# Patient Record
Sex: Female | Born: 1995 | Race: White | Hispanic: No | Marital: Single | Smoking: Former smoker
Health system: Southern US, Community
[De-identification: ages and names within clinical notes are randomized; demographics above are authoritative.]

---
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
## 2016-07-21 ENCOUNTER — Emergency Department: Payer: Self-pay

## 2016-07-21 ENCOUNTER — Emergency Department
Admission: EM | Admit: 2016-07-21 | Discharge: 2016-07-21 | Disposition: A | Discharge status: Home / Self Care | Payer: Self-pay | Attending: Emergency Medicine | Admitting: Emergency Medicine

## 2016-07-21 DIAGNOSIS — Y999 Unspecified external cause status: Secondary | ICD-10-CM | POA: Insufficient documentation

## 2016-07-21 DIAGNOSIS — Y929 Unspecified place or not applicable: Secondary | ICD-10-CM | POA: Insufficient documentation

## 2016-07-21 DIAGNOSIS — S42401A Unspecified fracture of lower end of right humerus, initial encounter for closed fracture: Secondary | ICD-10-CM | POA: Insufficient documentation

## 2016-07-21 DIAGNOSIS — Y9351 Activity, roller skating (inline) and skateboarding: Secondary | ICD-10-CM | POA: Insufficient documentation

## 2016-07-21 MED ORDER — NAPROXEN 500 MG PO TBEC: 500.0000 mg | DELAYED_RELEASE_TABLET | Freq: Two times a day (BID) | ORAL | 0 refills | Status: DC

## 2016-07-21 MED ORDER — HYDROCODONE-ACETAMINOPHEN 5-325 MG PO TABS: 1.0000 | ORAL_TABLET | Freq: Three times a day (TID) | ORAL | 0 refills | Status: AC | PRN

## 2016-07-21 MED ORDER — HYDROCODONE-ACETAMINOPHEN 5-325 MG PO TABS
1.0000 | ORAL_TABLET | Freq: Once | ORAL | Status: AC
  Administered 2016-07-21: 1 via ORAL
  Filled 2016-07-21: qty 1

## 2016-07-21 NOTE — ED Triage Notes (Signed)
Pt states she fell off a skate board and injured her right elbow..Marland Kitchen

## 2016-07-21 NOTE — ED Provider Notes (Signed)
[facility] Emergency Department Provider Note ____________________________________________  Time seen: 1225  I have reviewed the triage vital signs and the nursing notes.  HISTORY  Chief Complaint  Elbow Pain  HPI Faustino CongressJennifer J Kops is a 20 y.o. female, left-hand dominant, reports to the ED for evaluation of right elbow pain. Patient describes falling while skateboarding about an hour prior to arrival. Since that time she's had increased pain and tightness to the right elbow. Patient also notes some tingling to the fingertips distally. She denies any other injury at this time.  History reviewed. No pertinent past medical history.  Patient Active Problem List   Diagnosis Date Noted  . URI 11/30/2007    History reviewed. No pertinent surgical history.  Prior to Admission medications   Medication Sig Start Date End Date Taking? Authorizing Provider  diphenhydrAMINE (SOMINEX) 25 MG tablet Take 25 mg by mouth at bedtime as needed. For allergy    Historical Provider, MD  HYDROcodone-acetaminophen (NORCO) 5-325 MG tablet Take 1 tablet by mouth every 8 (eight) hours as needed. 07/21/16   Destry Bezdek V Bacon Tyria Springer, PA-C  naproxen (EC NAPROSYN) 500 MG EC tablet Take 1 tablet (500 mg total) by mouth 2 (two) times daily with a meal. 07/21/16   Lazara Grieser V Bacon Nygeria Lager, PA-C    Allergies Review of patient's allergies indicates no known allergies.  No family history on file.  Social History Social History  Substance Use Topics  . Smoking status: Never Smoker  . Smokeless tobacco: Never Used  . Alcohol use Yes    Review of Systems  Constitutional: Negative for fever. Cardiovascular: Negative for chest pain. Respiratory: Negative for shortness of breath. Musculoskeletal: Negative for back pain. Right elbow pain as above. Skin: Negative for rash. Neurological: Negative for headaches, focal weakness or  numbness. ____________________________________________  PHYSICAL EXAM:  VITAL SIGNS: ED Triage Vitals  Enc Vitals Group     BP 07/21/16 1152 (!) 110/99     Pulse Rate 07/21/16 1152 (!) 142     Resp 07/21/16 1152 16     Temp 07/21/16 1152 98.7 F (37.1 C)     Temp Source 07/21/16 1152 Oral     SpO2 07/21/16 1152 94 %     Weight 07/21/16 1143 116 lb (52.6 kg)     Height 07/21/16 1143 5\' 5"  (1.651 m)     Head Circumference --      Peak Flow --      Pain Score 07/21/16 1143 10     Pain Loc --      Pain Edu? --      Excl. in GC? --    Constitutional: Alert and oriented. Well appearing and in no distress. Head: Normocephalic and atraumatic. Cardiovascular: Normal rate, regular rhythm. Normal distal pulses. Respiratory: Normal respiratory effort. No wheezes/rales/rhonchi. Gastrointestinal: Soft and nontender. No distention. Musculoskeletal:Right elbow without obvious deformity, dislocation, or effusion. Patient is tender to palpation over the medial and lateral epicondyles. She didn't demonstrate a normal composite fist distally. Decreased elbow range of motion secondary to pain. Nontender with normal range of motion in all extremities.  Neurologic:  Normal gait without ataxia. Normal speech and language. No gross focal neurologic deficits are appreciated. Skin:  Skin is warm, dry and intact. No rash noted. ____________________________________________   RADIOLOGY  Right Elbow  IMPRESSION: Positive for elbow joint effusion. In the setting of trauma this likely indicates an intra-articular fracture, statistically most likely the radial head. Consider radial head x-ray views, if it  would affect clinical management.  I, Mahalie Kanner, Charlesetta IvoryJenise V Bacon, personally viewed and evaluated these images (plain radiographs) as part of my medical decision making, as well as reviewing the written report by the radiologist. ____________________________________________  PROCEDURES  Elbow OCL  Splint Arm sling ____________________________________________  INITIAL IMPRESSION / ASSESSMENT AND PLAN / ED COURSE  Patient with an elbow joint effusion confirmed on x-ray. Questionable intra-articular radial head fracture seems probable. She is fitted with an appropriate OCL splint and arm sling. She will follow-up with orthopedics for further fracture care.  Clinical Course   ____________________________________________  FINAL CLINICAL IMPRESSION(S) / ED DIAGNOSES  Final diagnoses:  Elbow fracture, right, closed, initial encounter     Lissa HoardJenise V Bacon Isiaha Greenup, PA-C 07/21/16 1934    Governor Rooksebecca Lord, MD 07/23/16 1441

## 2016-07-21 NOTE — Discharge Instructions (Signed)
Take the pain medicine and anti-inflammatory as directed. Apply ice to reduce symptoms. Call Dr. Rosita KeaMenz to schedule a follow-up appointment.

## 2016-08-29 ENCOUNTER — Emergency Department (HOSPITAL_COMMUNITY)
Admission: EM | Admit: 2016-08-29 | Discharge: 2016-08-29 | Disposition: A | Discharge status: Home / Self Care | Payer: No Typology Code available for payment source | Attending: Emergency Medicine | Admitting: Emergency Medicine

## 2016-08-29 ENCOUNTER — Encounter (HOSPITAL_COMMUNITY): Payer: Self-pay

## 2016-08-29 ENCOUNTER — Emergency Department (HOSPITAL_COMMUNITY): Payer: No Typology Code available for payment source

## 2016-08-29 DIAGNOSIS — Y92411 Interstate highway as the place of occurrence of the external cause: Secondary | ICD-10-CM | POA: Diagnosis not present

## 2016-08-29 DIAGNOSIS — S5001XA Contusion of right elbow, initial encounter: Secondary | ICD-10-CM | POA: Insufficient documentation

## 2016-08-29 DIAGNOSIS — F1290 Cannabis use, unspecified, uncomplicated: Secondary | ICD-10-CM | POA: Insufficient documentation

## 2016-08-29 DIAGNOSIS — S63501A Unspecified sprain of right wrist, initial encounter: Secondary | ICD-10-CM | POA: Insufficient documentation

## 2016-08-29 DIAGNOSIS — Y9389 Activity, other specified: Secondary | ICD-10-CM | POA: Diagnosis not present

## 2016-08-29 DIAGNOSIS — Z79899 Other long term (current) drug therapy: Secondary | ICD-10-CM | POA: Insufficient documentation

## 2016-08-29 DIAGNOSIS — S6991XA Unspecified injury of right wrist, hand and finger(s), initial encounter: Secondary | ICD-10-CM | POA: Diagnosis present

## 2016-08-29 DIAGNOSIS — Z87891 Personal history of nicotine dependence: Secondary | ICD-10-CM | POA: Diagnosis not present

## 2016-08-29 DIAGNOSIS — Y999 Unspecified external cause status: Secondary | ICD-10-CM | POA: Diagnosis not present

## 2016-08-29 MED ORDER — METHOCARBAMOL 500 MG PO TABS: ORAL_TABLET | ORAL | 0 refills | Status: AC

## 2016-08-29 MED ORDER — NAPROXEN 250 MG PO TABS: 250.0000 mg | ORAL_TABLET | Freq: Two times a day (BID) | ORAL | 0 refills | Status: AC

## 2016-08-29 MED ORDER — IBUPROFEN 400 MG PO TABS
600.0000 mg | ORAL_TABLET | Freq: Once | ORAL | Status: AC
  Administered 2016-08-29: 600 mg via ORAL
  Filled 2016-08-29: qty 2
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
## 2016-08-29 NOTE — Discharge Instructions (Signed)
Ice packs to the injured or sore muscles for the next several days then start using heat. Take the medications for pain and muscle spasms. Return to the ED for any problems listed on the head injury sheet. Wear the splint on your wrist and use the ace wrap for your elbow. Recheck if you aren't improving in the next week. Call Dr Mort SawyersHarrison's office to get an appointment. He is an Scientist, research (life sciences)orthopedist.
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
